# Patient Record
Sex: Female | Born: 1980 | Race: Black or African American | Hispanic: No | Marital: Single | Smoking: Never smoker
Health system: Southern US, Community
[De-identification: ages and names within clinical notes are randomized; demographics above are authoritative.]

## PROBLEM LIST (undated history)

## (undated) HISTORY — PX: ANTERIOR CRUCIATE LIGAMENT REPAIR: SHX115

## (undated) HISTORY — PX: MYOMECTOMY: SHX85

---

## 2009-04-30 ENCOUNTER — Emergency Department (HOSPITAL_BASED_OUTPATIENT_CLINIC_OR_DEPARTMENT_OTHER): Admission: EM | Admit: 2009-04-30 | Discharge: 2009-04-30 | Payer: Self-pay | Admitting: Emergency Medicine

## 2018-03-17 ENCOUNTER — Emergency Department (HOSPITAL_BASED_OUTPATIENT_CLINIC_OR_DEPARTMENT_OTHER): Payer: BLUE CROSS/BLUE SHIELD

## 2018-03-17 ENCOUNTER — Encounter (HOSPITAL_BASED_OUTPATIENT_CLINIC_OR_DEPARTMENT_OTHER): Payer: Self-pay

## 2018-03-17 ENCOUNTER — Other Ambulatory Visit: Payer: Self-pay

## 2018-03-17 DIAGNOSIS — R0789 Other chest pain: Secondary | ICD-10-CM | POA: Insufficient documentation

## 2018-03-17 DIAGNOSIS — R079 Chest pain, unspecified: Secondary | ICD-10-CM | POA: Diagnosis present

## 2018-03-17 LAB — CBC
HEMATOCRIT: 40.6 % (ref 36.0–46.0)
HEMOGLOBIN: 13.3 g/dL (ref 12.0–15.0)
MCH: 27.9 pg (ref 26.0–34.0)
MCHC: 32.8 g/dL (ref 30.0–36.0)
MCV: 85.3 fL (ref 78.0–100.0)
Platelets: 293 10*3/uL (ref 150–400)
RBC: 4.76 MIL/uL (ref 3.87–5.11)
RDW: 13.2 % (ref 11.5–15.5)
WBC: 4.6 10*3/uL (ref 4.0–10.5)

## 2018-03-17 NOTE — ED Triage Notes (Signed)
C/o CP day 2-NAD-steady gait 

## 2018-03-18 ENCOUNTER — Emergency Department (HOSPITAL_BASED_OUTPATIENT_CLINIC_OR_DEPARTMENT_OTHER)
Admission: EM | Admit: 2018-03-18 | Discharge: 2018-03-18 | Disposition: A | Discharge status: Home / Self Care | Payer: BLUE CROSS/BLUE SHIELD | Attending: Emergency Medicine | Admitting: Emergency Medicine

## 2018-03-18 DIAGNOSIS — R0789 Other chest pain: Secondary | ICD-10-CM

## 2018-03-18 LAB — BASIC METABOLIC PANEL
ANION GAP: 9 (ref 5–15)
BUN: 10 mg/dL (ref 6–20)
CALCIUM: 8.6 mg/dL — AB (ref 8.9–10.3)
CO2: 26 mmol/L (ref 22–32)
Chloride: 103 mmol/L (ref 98–111)
Creatinine, Ser: 0.65 mg/dL (ref 0.44–1.00)
Glucose, Bld: 95 mg/dL (ref 70–99)
POTASSIUM: 3.4 mmol/L — AB (ref 3.5–5.1)
SODIUM: 138 mmol/L (ref 135–145)

## 2018-03-18 LAB — TROPONIN I

## 2018-03-18 MED ORDER — OMEPRAZOLE 20 MG PO CPDR: 20.0000 mg | DELAYED_RELEASE_CAPSULE | Freq: Every day | ORAL | 0 refills | Status: AC

## 2018-03-18 MED ORDER — PANTOPRAZOLE SODIUM 40 MG PO TBEC
40.0000 mg | DELAYED_RELEASE_TABLET | Freq: Once | ORAL | Status: AC
  Administered 2018-03-18: 40 mg via ORAL
  Filled 2018-03-18: qty 1

## 2018-03-18 NOTE — ED Notes (Signed)
Pt has no Urine at this time to provide

## 2018-03-18 NOTE — ED Notes (Signed)
Pt c/o midline chest pain for the last two days that is unrelieved by pepcid.

## 2018-03-18 NOTE — ED Provider Notes (Signed)
[facility] Provider Note: Lowella Dell, MD, FACEP  CSN: [id number] MRN: [id number] ARRIVAL: 03/17/18 at 2254 ROOM: [facility]04/[facility]04   CHIEF COMPLAINT  Chest Pain   HISTORY OF PRESENT ILLNESS  03/18/18 3:10 AM Deanna Pace is a 37 y.o. female who developed pain in the center of her chest the evening before yesterday.  She describes the pain as a pressure, moderate in intensity.  It was located in the center of her chest with some radiation to the right shoulder.  Nothing made the pain better or worse except some transient improvement after taking Pepcid yesterday.  There was no associated shortness of breath or diaphoresis.  She did have some transient nausea earlier this morning.  She has not had any discomfort since about midnight.  She denies lower extremity pain or swelling.  She denies recent travel.   History reviewed. No pertinent past medical history.  Past Surgical History:  Procedure Laterality Date  . ANTERIOR CRUCIATE LIGAMENT REPAIR    . MYOMECTOMY      No family history on file.  Social History   Tobacco Use  . Smoking status: Never Smoker  . Smokeless tobacco: Never Used  Substance Use Topics  . Alcohol use: Yes    Comment: daily  . Drug use: Not Currently    Prior to Admission medications   Not on File    Allergies Patient has no known allergies.   REVIEW OF SYSTEMS  Negative except as noted here or in the History of Present Illness.   PHYSICAL EXAMINATION  Initial Vital Signs Blood pressure 134/85, pulse 61, temperature 98.4 F (36.9 C), temperature source Oral, resp. rate 16, height 5\' 7"  (1.702 m), weight 104.8 kg (231 lb), last menstrual period 03/17/2018, SpO2 100 %.  Examination General: Well-developed, well-nourished female in no acute distress; appearance consistent with age of record HENT: normocephalic; atraumatic Eyes: pupils equal, round and reactive to light; extraocular muscles intact Neck: supple Heart: regular rate  and rhythm Lungs: clear to auscultation bilaterally Abdomen: soft; nondistended; nontender; bowel sounds present Extremities: No deformity; full range of motion; pulses normal; no edema; no calf tenderness Neurologic: Awake, alert and oriented; motor function intact in all extremities and symmetric; no facial droop Skin: Warm and dry Psychiatric: Normal mood and affect   RESULTS  Summary of this visit's results, reviewed by myself:   EKG Interpretation  Date/Time:  Monday March 17 2018 23:09:00 EDT Ventricular Rate:  68 PR Interval:  180 QRS Duration: 84 QT Interval:  426 QTC Calculation: 452 R Axis:   9 Text Interpretation:  Normal sinus rhythm Normal ECG No previous ECGs available Confirmed by Griffyn Kucinski, Jonny Ruiz ([id number]) on 03/17/2018 11:14:25 PM      Laboratory Studies: Results for orders placed or performed during the hospital encounter of 03/18/18 (from the past 24 hour(s))  Basic metabolic panel     Status: Abnormal   Collection Time: 03/17/18 11:47 PM  Result Value Ref Range   Sodium 138 135 - 145 mmol/L   Potassium 3.4 (L) 3.5 - 5.1 mmol/L   Chloride 103 98 - 111 mmol/L   CO2 26 22 - 32 mmol/L   Glucose, Bld 95 70 - 99 mg/dL   BUN 10 6 - 20 mg/dL   Creatinine, Ser 6.21 0.44 - 1.00 mg/dL   Calcium 8.6 (L) 8.9 - 10.3 mg/dL   GFR calc non Af Amer >60 >60 mL/min   GFR calc Af Amer >60 >60 mL/min   Anion gap 9 5 -  15  CBC     Status: None   Collection Time: 03/17/18 11:47 PM  Result Value Ref Range   WBC 4.6 4.0 - 10.5 K/uL   RBC 4.76 3.87 - 5.11 MIL/uL   Hemoglobin 13.3 12.0 - 15.0 g/dL   HCT 16.140.6 09.636.0 - 04.546.0 %   MCV 85.3 78.0 - 100.0 fL   MCH 27.9 26.0 - 34.0 pg   MCHC 32.8 30.0 - 36.0 g/dL   RDW 40.913.2 81.111.5 - 91.415.5 %   Platelets 293 150 - 400 K/uL  Troponin I     Status: None   Collection Time: 03/17/18 11:47 PM  Result Value Ref Range   Troponin I <0.03 <0.03 ng/mL  Troponin I     Status: None   Collection Time: 03/18/18  3:23 AM  Result Value Ref Range    Troponin I <0.03 <0.03 ng/mL   Imaging Studies: Dg Chest 2 View  Result Date: 03/17/2018 CLINICAL DATA:  Central chest pain since yesterday. EXAM: CHEST - 2 VIEW COMPARISON:  None. FINDINGS: The cardiomediastinal contours are normal. The lungs are clear. Pulmonary vasculature is normal. No consolidation, pleural effusion, or pneumothorax. No acute osseous abnormalities are seen. IMPRESSION: No acute pulmonary process. Electronically Signed   By: Rubye OaksMelanie  Ehinger M.D.   On: 03/17/2018 23:25    ED COURSE and MDM  Nursing notes and initial vitals signs, including pulse oximetry, reviewed.  Vitals:   03/17/18 2306 03/18/18 0149  BP: (!) 163/91 134/85  Pulse: 74 61  Resp: 18 16  Temp: 98.4 F (36.9 C)   TempSrc: Oral   SpO2: 100% 100%  Weight: 104.8 kg (231 lb)   Height: 5\' 7"  (1.702 m)    There is no evidence of cardiac ischemia on EKG or serial troponins.  Her history is atypical for cardiac etiology as well.  This may represent GERD and she was given Protonix in the ED.  PROCEDURES    ED DIAGNOSES     ICD-10-CM   1. Atypical chest pain R07.89        Paula LibraMolpus, Kaedyn Belardo, MD 03/18/18 0400

## 2019-03-23 IMAGING — CR DG CHEST 2V
2 series · 2 of 2 positions shown · non-contrast
Comparison: None.

CLINICAL DATA: Central chest pain since yesterday.

EXAM:
CHEST - 2 VIEW

[w chest pa]
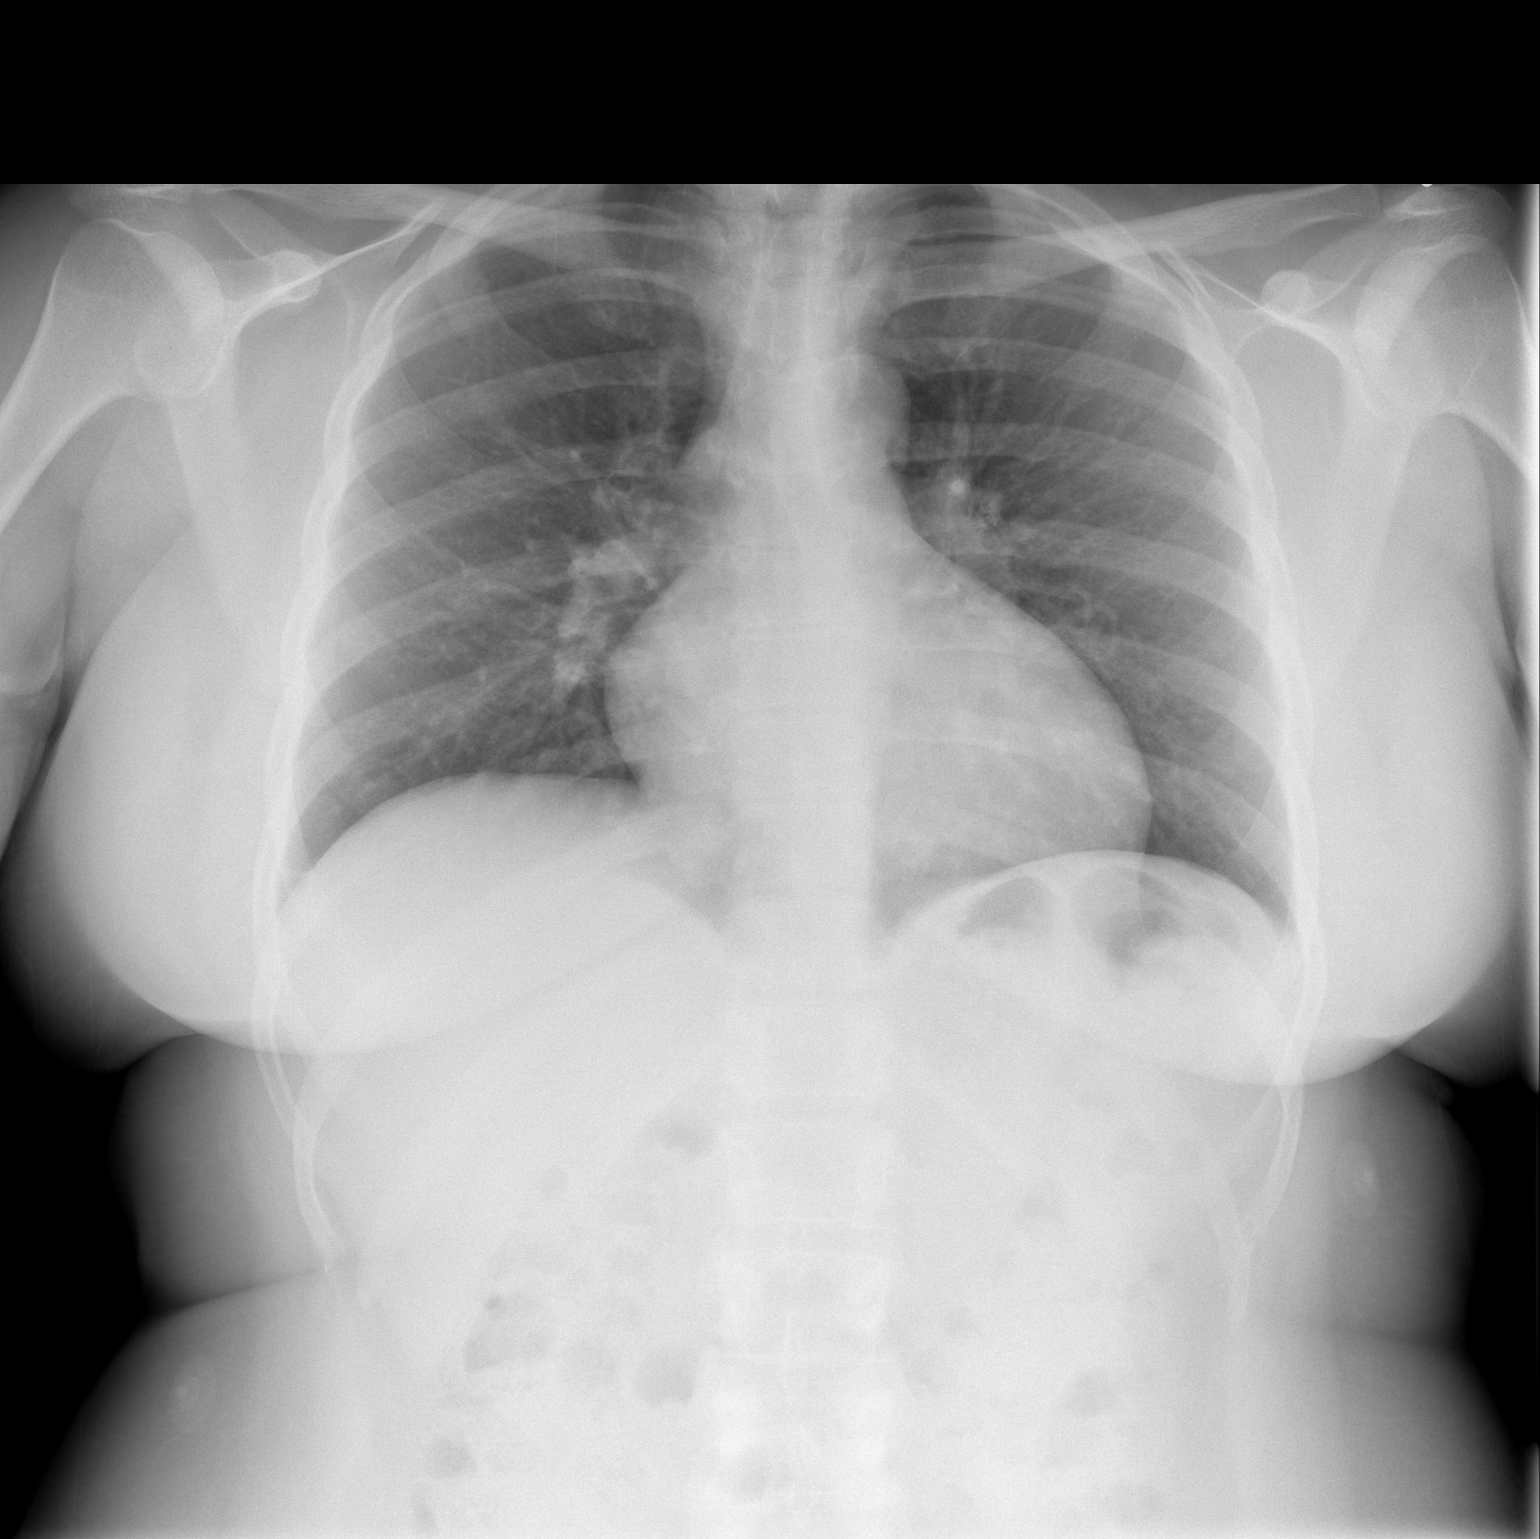

[w chest lat]
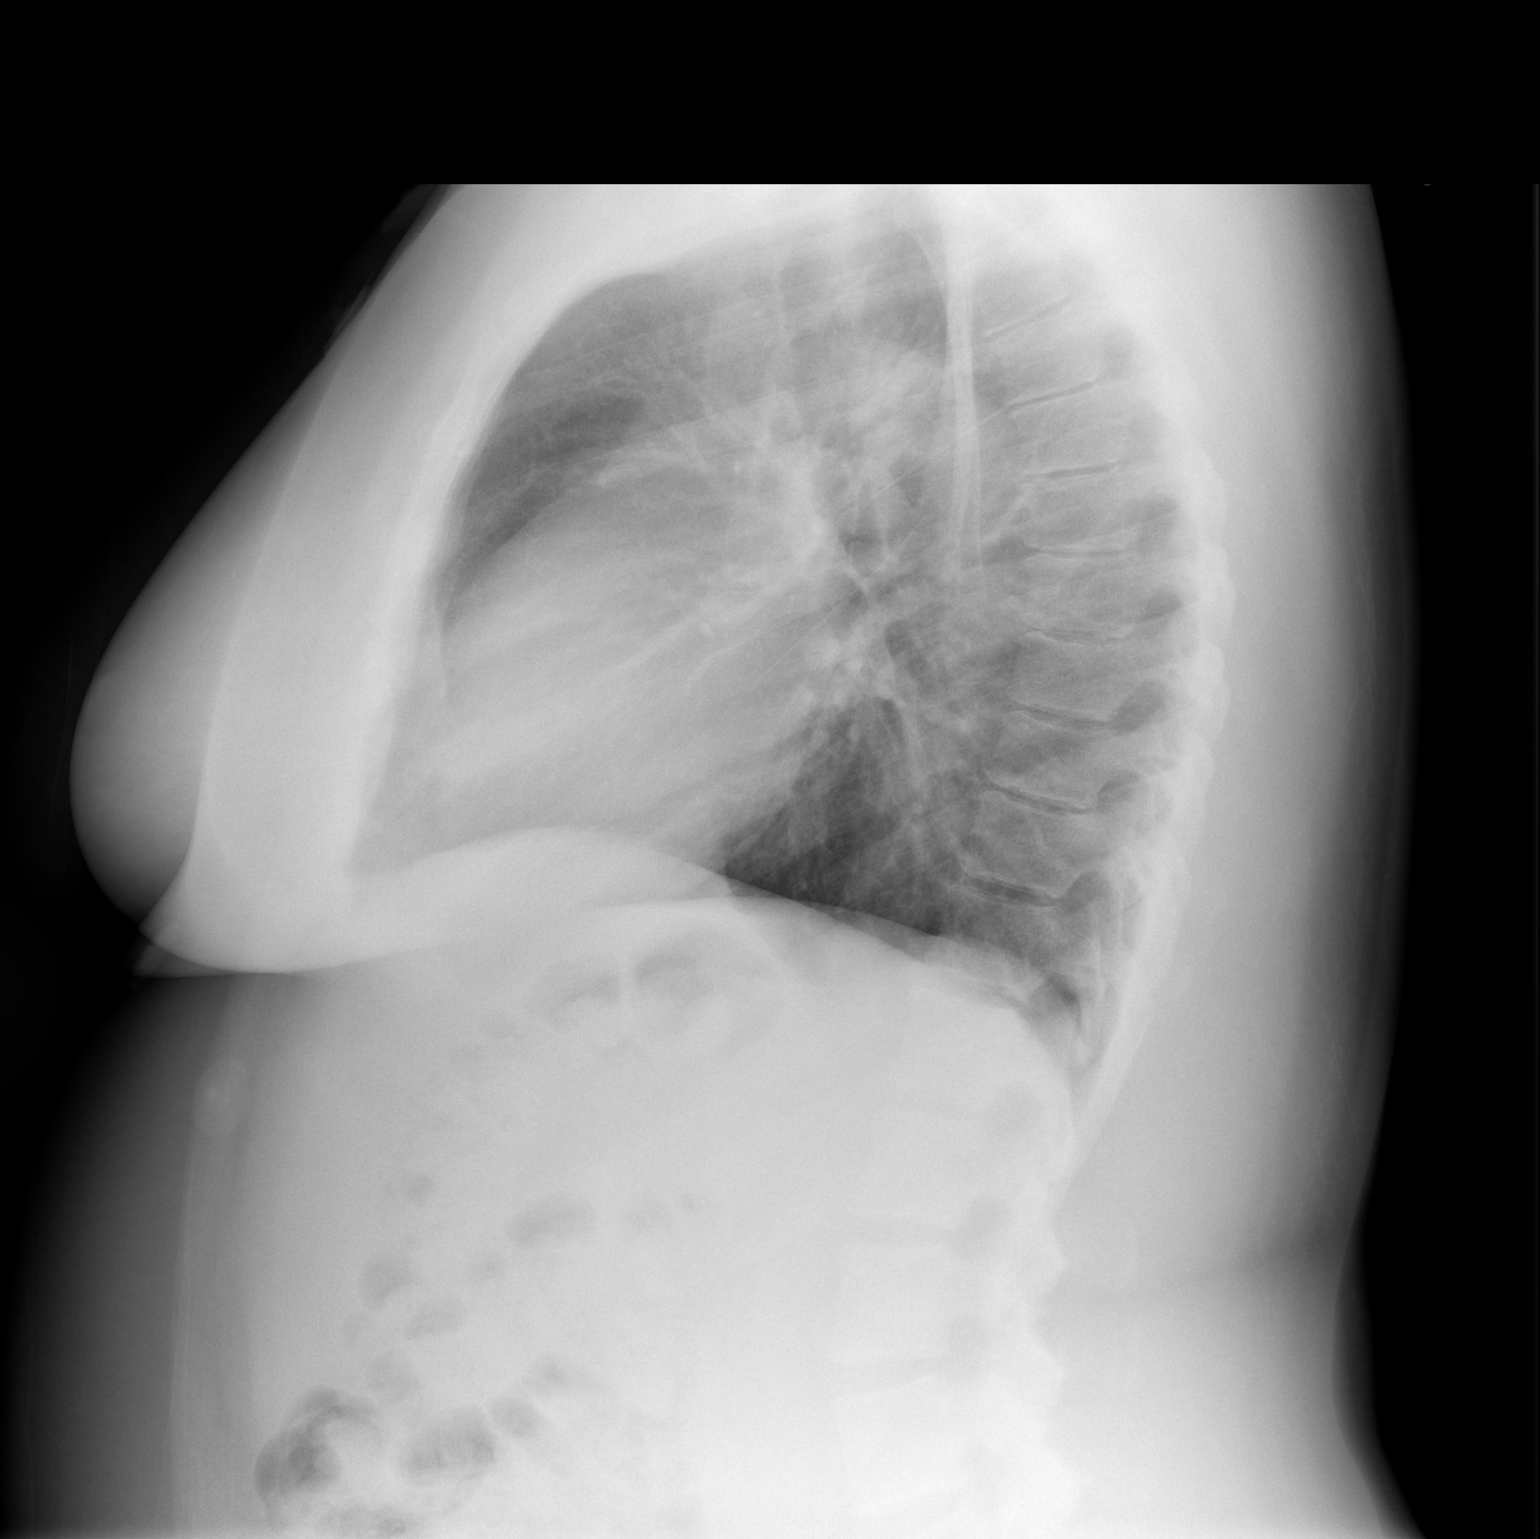

[2 of 2 positions shown; findings below may reference images not displayed]

FINDINGS: The cardiomediastinal contours are normal. The lungs are clear.
Pulmonary vasculature is normal. No consolidation, pleural effusion,
or pneumothorax. No acute osseous abnormalities are seen.
IMPRESSION: No acute pulmonary process.

## 2023-07-24 ENCOUNTER — Emergency Department (HOSPITAL_BASED_OUTPATIENT_CLINIC_OR_DEPARTMENT_OTHER): Payer: BLUE CROSS/BLUE SHIELD

## 2023-07-24 ENCOUNTER — Other Ambulatory Visit: Payer: Self-pay

## 2023-07-24 ENCOUNTER — Encounter (HOSPITAL_BASED_OUTPATIENT_CLINIC_OR_DEPARTMENT_OTHER): Payer: Self-pay | Admitting: Emergency Medicine

## 2023-07-24 ENCOUNTER — Emergency Department (HOSPITAL_BASED_OUTPATIENT_CLINIC_OR_DEPARTMENT_OTHER)
Admission: EM | Admit: 2023-07-24 | Discharge: 2023-07-24 | Disposition: A | Discharge status: Home / Self Care | Payer: BLUE CROSS/BLUE SHIELD | Attending: Emergency Medicine | Admitting: Emergency Medicine

## 2023-07-24 DIAGNOSIS — R42 Dizziness and giddiness: Secondary | ICD-10-CM | POA: Insufficient documentation

## 2023-07-24 DIAGNOSIS — D72819 Decreased white blood cell count, unspecified: Secondary | ICD-10-CM | POA: Insufficient documentation

## 2023-07-24 DIAGNOSIS — E871 Hypo-osmolality and hyponatremia: Secondary | ICD-10-CM | POA: Insufficient documentation

## 2023-07-24 DIAGNOSIS — R030 Elevated blood-pressure reading, without diagnosis of hypertension: Secondary | ICD-10-CM

## 2023-07-24 DIAGNOSIS — R519 Headache, unspecified: Secondary | ICD-10-CM | POA: Insufficient documentation

## 2023-07-24 LAB — CBC WITH DIFFERENTIAL/PLATELET
Abs Immature Granulocytes: 0 10*3/uL (ref 0.00–0.07)
Basophils Absolute: 0 10*3/uL (ref 0.0–0.1)
Basophils Relative: 1 %
Eosinophils Absolute: 0.1 10*3/uL (ref 0.0–0.5)
Eosinophils Relative: 3 %
HCT: 37.1 % (ref 36.0–46.0)
Hemoglobin: 11.5 g/dL — ABNORMAL LOW (ref 12.0–15.0)
Immature Granulocytes: 0 %
Lymphocytes Relative: 45 %
Lymphs Abs: 1.6 10*3/uL (ref 0.7–4.0)
MCH: 25.4 pg — ABNORMAL LOW (ref 26.0–34.0)
MCHC: 31 g/dL (ref 30.0–36.0)
MCV: 82.1 fL (ref 80.0–100.0)
Monocytes Absolute: 0.3 10*3/uL (ref 0.1–1.0)
Monocytes Relative: 9 %
Neutro Abs: 1.5 10*3/uL — ABNORMAL LOW (ref 1.7–7.7)
Neutrophils Relative %: 42 %
Platelets: 364 10*3/uL (ref 150–400)
RBC: 4.52 MIL/uL (ref 3.87–5.11)
RDW: 16.3 % — ABNORMAL HIGH (ref 11.5–15.5)
WBC: 3.6 10*3/uL — ABNORMAL LOW (ref 4.0–10.5)
nRBC: 0 % (ref 0.0–0.2)

## 2023-07-24 LAB — BASIC METABOLIC PANEL
Anion gap: 9 (ref 5–15)
BUN: 7 mg/dL (ref 6–20)
CO2: 26 mmol/L (ref 22–32)
Calcium: 8.6 mg/dL — ABNORMAL LOW (ref 8.9–10.3)
Chloride: 103 mmol/L (ref 98–111)
Creatinine, Ser: 0.72 mg/dL (ref 0.44–1.00)
GFR, Estimated: >60 mL/min (ref >60)
Glucose, Bld: 87 mg/dL (ref 70–99)
Potassium: 3.4 mmol/L — ABNORMAL LOW (ref 3.5–5.1)
Sodium: 138 mmol/L (ref 135–145)

## 2023-07-24 LAB — HCG, SERUM, QUALITATIVE: Preg, Serum: NEGATIVE

## 2023-07-24 MED ORDER — GADOBUTROL 1 MMOL/ML IV SOLN
9.0000 mL | Freq: Once | INTRAVENOUS | Status: AC | PRN
  Administered 2023-07-24: 9 mL via INTRAVENOUS

## 2023-07-24 MED ORDER — DIPHENHYDRAMINE HCL 50 MG/ML IJ SOLN
12.5000 mg | Freq: Once | INTRAMUSCULAR | Status: AC
  Administered 2023-07-24: 12.5 mg via INTRAVENOUS
  Filled 2023-07-24: qty 1

## 2023-07-24 MED ORDER — PROCHLORPERAZINE EDISYLATE 10 MG/2ML IJ SOLN
10.0000 mg | Freq: Once | INTRAMUSCULAR | Status: AC
  Administered 2023-07-24: 10 mg via INTRAVENOUS
  Filled 2023-07-24: qty 2

## 2023-07-24 MED ORDER — IOHEXOL 350 MG/ML SOLN
75.0000 mL | Freq: Once | INTRAVENOUS | Status: AC | PRN
  Administered 2023-07-24: 75 mL via INTRAVENOUS

## 2023-07-24 NOTE — ED Notes (Signed)
D/c paperwork reviewed with pt, including follow up care.  All questions and/or concerns addressed at time of d/c.  No further needs expressed. . Pt verbalized understanding, Ambulatory without assistance to ED exit, NAD.   

## 2023-07-24 NOTE — ED Provider Notes (Signed)
[facility] Provider Note   CSN: [id number] Arrival date & time: 07/24/23  1350     History  Chief Complaint  Patient presents with   Headache    Deanna Pace is a 42 y.o. female with no significant past medical history who presents to the ED due to lightheadedness and headache that started around 10 AM this morning while doing laundry.  Patient states she bent over and developed sudden onset of lightheadedness and a headache.  She notes she then checked her BP which was elevated at 165/90.  Typical BPs run in the 130s to 140s.  No history of hypertension.  Patient described her vision as initially appearing "cloudy" which has resolved during initial evaluation.  Denies speech changes.  Admits to left-sided numbness/tingling of the face.  Denies any unilateral weakness.  No history of migraines.  No history of CVA. No medical conditions.   History obtained from patient and past medical records. No interpreter used during encounter.       Home Medications Prior to Admission medications   Medication Sig Start Date End Date Taking? Authorizing Provider  omeprazole (PRILOSEC) 20 MG capsule Take 1 capsule (20 mg total) by mouth daily. 03/18/18   Molpus, Jonny Ruiz, MD      Allergies    Patient has no known allergies.    Review of Systems   Review of Systems  Eyes:  Positive for visual disturbance (resolved).  Respiratory:  Negative for shortness of breath.   Cardiovascular:  Negative for chest pain.  Neurological:  Positive for light-headedness, numbness and headaches. Negative for speech difficulty and weakness.    Physical Exam Updated Vital Signs BP 138/88   Pulse (!) 51   Temp 97.8 F (36.6 C)   Resp 13   Wt 92.5 kg   LMP 07/22/2023 (Exact Date)   SpO2 100%   BMI 31.95 kg/m  Physical Exam Vitals and nursing note reviewed.  Constitutional:      General: She is not in acute distress.    Appearance: She is not ill-appearing.   HENT:     Head: Normocephalic.  Eyes:     Pupils: Pupils are equal, round, and reactive to light.  Cardiovascular:     Rate and Rhythm: Normal rate and regular rhythm.     Pulses: Normal pulses.     Heart sounds: Normal heart sounds. No murmur heard.    No friction rub. No gallop.  Pulmonary:     Effort: Pulmonary effort is normal.     Breath sounds: Normal breath sounds.  Abdominal:     General: Abdomen is flat. There is no distension.     Palpations: Abdomen is soft.     Tenderness: There is no abdominal tenderness. There is no guarding or rebound.  Musculoskeletal:        General: Normal range of motion.     Cervical back: Neck supple.  Skin:    General: Skin is warm and dry.  Neurological:     General: No focal deficit present.     Mental Status: She is alert.     Comments: Speech is clear, able to follow commands CN III-XII intact Normal strength in upper and lower extremities bilaterally including dorsiflexion and plantar flexion, strong and equal grip strength Subjective decreased in sensation on LUE. Moves extremities without ataxia, coordination intact No pronator drift Ambulates without difficulty    Psychiatric:        Mood and Affect: Mood  normal.        Behavior: Behavior normal.     ED Results / Procedures / Treatments   Labs (all labs ordered are listed, but only abnormal results are displayed) Labs Reviewed  CBC WITH DIFFERENTIAL/PLATELET - Abnormal; Notable for the following components:      Result Value   WBC 3.6 (*)    Hemoglobin 11.5 (*)    MCH 25.4 (*)    RDW 16.3 (*)    Neutro Abs 1.5 (*)    All other components within normal limits  BASIC METABOLIC PANEL - Abnormal; Notable for the following components:   Potassium 3.4 (*)    Calcium 8.6 (*)    All other components within normal limits  HCG, SERUM, QUALITATIVE    EKG EKG Interpretation Date/Time:  Wednesday July 24 2023 13:59:55 EST Ventricular Rate:  67 PR Interval:  150 QRS  Duration:  82 QT Interval:  426 QTC Calculation: 450 R Axis:   11  Text Interpretation: Normal sinus rhythm Nonspecific ST abnormality Abnormal ECG When compared with ECG of 17-Mar-2018 23:09, PREVIOUS ECG IS PRESENT Confirmed by Alvester Chou [phone]) on 07/24/2023 2:32:05 PM  Radiology MR Brain W and Wo Contrast  Result Date: 07/24/2023 CLINICAL DATA:  Neuro deficit, acute, stroke suspected EXAM: MRI HEAD WITHOUT AND WITH CONTRAST TECHNIQUE: Multiplanar, multiecho pulse sequences of the brain and surrounding structures were obtained without and with intravenous contrast. CONTRAST:  9mL GADAVIST GADOBUTROL 1 MMOL/ML IV SOLN COMPARISON:  None Available. FINDINGS: Brain: No acute infarction, hemorrhage, hydrocephalus, extra-axial collection or mass lesion. Vascular: Major arterial flow voids are maintained at the skull base. Skull and upper cervical spine: Normal marrow signal. Sinuses/Orbits: Mild paranasal sinus mucosal thickening. No acute orbital findings. IMPRESSION: 1. No evidence of acute intracranial abnormality. 2. Partially empty sella, which is often a normal anatomic variant but can be associated with idiopathic intracranial hypertension. Electronically Signed   By: Feliberto Harts M.D.   On: 07/24/2023 18:06   CT ANGIO HEAD NECK W WO CM  Result Date: 07/24/2023 CLINICAL DATA:  Cerebral aneurysm, untreated. Headache. Lightheadedness. Elevated blood pressure reading at home. EXAM: CT ANGIOGRAPHY HEAD AND NECK WITH AND WITHOUT CONTRAST TECHNIQUE: Multidetector CT imaging of the head and neck was performed using the standard protocol during bolus administration of intravenous contrast. Multiplanar CT image reconstructions and MIPs were obtained to evaluate the vascular anatomy. Carotid stenosis measurements (when applicable) are obtained utilizing NASCET criteria, using the distal internal carotid diameter as the denominator. RADIATION DOSE REDUCTION: This exam was performed according to  the departmental dose-optimization program which includes automated exposure control, adjustment of the mA and/or kV according to patient size and/or use of iterative reconstruction technique. CONTRAST:  75mL OMNIPAQUE IOHEXOL 350 MG/ML SOLN COMPARISON:  None. FINDINGS: CT HEAD FINDINGS Brain: Cerebral volume is normal. Partially empty sella turcica. There is no acute intracranial hemorrhage. No demarcated cortical infarct. No extra-axial fluid collection. No evidence of an intracranial mass. No midline shift. Vascular: No hyperdense vessel. Skull: No calvarial fracture or aggressive osseous lesion. Sinuses/Orbits: No orbital mass or acute orbital finding. 2.1 cm polyp within the left maxillary sinus. Mild mucosal thickening versus small mucous retention cyst within the right maxillary sinus. Mild mucosal thickening, and small-volume fluid, scattered within bilateral ethmoid air cells. Review of the MIP images confirms the above findings CTA NECK FINDINGS Aortic arch: Common origin of the innominate and left common carotid arteries. Streak/beam hardening artifact arising from a dense right-sided contrast bolus partially obscures  the right subclavian artery. Within this limitation, there is no appreciable hemodynamically significant innominate or proximal subclavian artery stenosis. Right carotid system: CCA and ICA patent within the neck without stenosis or significant atherosclerotic disease. No evidence of dissection. Left carotid system: CCA and ICA patent within the neck without stenosis or significant atherosclerotic disease. No evidence of dissection. Vertebral arteries: Venous reflux of contrast obscures the right vertebral artery V1 segment. Within this limitation, the vertebral arteries are patent within the neck without stenosis or evidence of dissection. Skeleton: Spondylosis at the cervical and visualized upper thoracic levels. Other neck: No neck mass or cervical lymphadenopathy. Upper chest: No  consolidation within the imaged lung apices. Review of the MIP images confirms the above findings CTA HEAD FINDINGS Anterior circulation: The intracranial internal carotid arteries are patent. The M1 middle cerebral arteries are patent. No M2 proximal branch occlusion or high-grade proximal stenosis. The anterior cerebral arteries are patent. Hypoplastic right A1 segment. No intracranial aneurysm is identified. Posterior circulation: The intracranial vertebral arteries are patent. The basilar artery is patent. The posterior cerebral arteries are patent. Posterior communicating arteries are present, bilaterally. Venous sinuses: Within the limitations of contrast timing, no convincing thrombus. Anatomic variants: As described. Review of the MIP images confirms the above findings IMPRESSION: Non-contrast head CT: 1. No evidence of an acute intracranial abnormality. 2. Partially empty sella turcica. This finding can reflect incidental anatomic variation, or alternatively, it can be associated with chronic idiopathic intracranial hypertension (pseudotumor cerebri). 3. Paranasal sinus disease as described. CTA neck: Venous reflux of contrast obscures the right vertebral artery V1 segment. Within this limitation, the common carotid, internal carotid and vertebral arteries are patent within the neck without stenosis or significant atherosclerotic disease. CTA head: 1. No proximal intracranial large vessel occlusion or proximal high-grade arterial stenosis identified. 2. No evidence of an intracranial aneurysm. Electronically Signed   By: Jackey Loge D.O.   On: 07/24/2023 15:53    Procedures Procedures    Medications Ordered in ED Medications  prochlorperazine (COMPAZINE) injection 10 mg (10 mg Intravenous Given 07/24/23 1447)  diphenhydrAMINE (BENADRYL) injection 12.5 mg (12.5 mg Intravenous Given 07/24/23 1446)  iohexol (OMNIPAQUE) 350 MG/ML injection 75 mL (75 mLs Intravenous Contrast Given 07/24/23 1512)   gadobutrol (GADAVIST) 1 MMOL/ML injection 9 mL (9 mLs Intravenous Contrast Given 07/24/23 1731)    ED Course/ Medical Decision Making/ A&P Clinical Course as of 07/24/23 1827  Wed Jul 24, 2023  1603 Reassessed patient at bedside.  Patient notes headache and numbness/tingling has completely resolved.  Will discuss with neurology for further recommendations. [CA]  1613 Rechecked BP which has improved to 150s systolic.  [CA]    Clinical Course User Index [CA] Mannie Stabile, PA-C                                 Medical Decision Making Amount and/or Complexity of Data Reviewed Labs: ordered. Decision-making details documented in ED Course. Radiology: ordered and independent interpretation performed. Decision-making details documented in ED Course.  Risk Prescription drug management.   This patient presents to the ED for concern of headache/lightheadedness/numbness, this involves an extensive number of treatment options, and is a complaint that carries with it a high risk of complications and morbidity.  The differential diagnosis includes CVA, complicated migraine, TIA, MS, etc  42 year old female presents to the ED due to sudden onset of lightheadedness and headache that started around 10 AM  this morning with elevated BP.  No history of hypertension.  Patient also admits to some left facial/upper extremity numbness/tingling.  No speech changes.  No previous CVA. Upon arrival BP elevated 175/91.  Patient well-appearing on exam.  Subjective decreased sensation to left upper extremity however, no other neurological deficits.  CTA head and neck ordered after discussion with Dr. Renaye Rakers. Routine labs ordered. Migraine cocktail given.   CBC significant for leukopenia at 3.6 and anemia with hemoglobin 11.5.  Normal platelets.  BMP significant for hyponatremia at 3.4.  Normal renal function.  Pregnancy test negative.  CTA negative for any acute abnormalities. Agree with radiology.  4:13 PM  Discussed with Dr. Iver Nestle with neurology who recommends MRI brain w/ and w/o. Of note, on CTA head/neck venous reflux of contrast obscures right vertebral artery; however Dr. Iver Nestle has lower suspicion for dissection given patient's symptoms.   MRI negative for acute abnormalities.  Does demonstrate partially empty sella which could be a normal variant versus idiopathic intracranial hypertension.  Patient made aware of incidental finding and advised to follow-up with PCP for further evaluation.  6:27 PM reassessed patient at bedside.  Headache completely resolved.  BP normalized to 138/88.  Patient stable for discharge.  Advised patient to follow-up with PCP within the next few days for recheck of BP. Strict ED precautions discussed with patient. Patient states understanding and agrees to plan. Patient discharged home in no acute distress and stable vitals  PCP on file       Final Clinical Impression(s) / ED Diagnoses Final diagnoses:  Elevated blood pressure reading  Acute nonintractable headache, unspecified headache type  Lightheadedness    Rx / DC Orders ED Discharge Orders     None         Jesusita Oka 07/24/23 1827    Terald Sleeper, MD 07/25/23 [phone]

## 2023-07-24 NOTE — ED Triage Notes (Signed)
Lightheaded and headache, high BP reading at home , no Hx of it , no chest pain or shortness of breath . No weakness to extremities . Alert and oriented x 4

## 2023-07-24 NOTE — Discharge Instructions (Signed)
It was a pleasure taking care of you today.  As discussed, your MRI did not show evidence of a stroke.  It did show an empty sella turcica which is sometimes indicative of idiopathic intracranial hypertension.  Please follow-up with PCP for further evaluation.  Your blood pressure was elevated while you were in the ER which then normalized.  Please have your BP rechecked in 2 to 3 days by PCP.  Return to the ER for any worsening symptoms.
